# Patient Record
Sex: Female | Born: 1975 | Race: Black or African American | Hispanic: No | Marital: Single | Smoking: Current every day smoker
Health system: Southern US, Community
[De-identification: ages and names within clinical notes are randomized; demographics above are authoritative.]

## PROBLEM LIST (undated history)

## (undated) DIAGNOSIS — J45909 Unspecified asthma, uncomplicated: Secondary | ICD-10-CM

## (undated) HISTORY — DX: Unspecified asthma, uncomplicated: J45.909

---

## 2014-07-08 HISTORY — PX: HERNIA REPAIR: SHX51

## 2019-09-14 ENCOUNTER — Encounter: Payer: Self-pay | Admitting: Obstetrics & Gynecology

## 2019-09-14 ENCOUNTER — Ambulatory Visit (INDEPENDENT_AMBULATORY_CARE_PROVIDER_SITE_OTHER): Payer: Medicaid Other | Admitting: Obstetrics & Gynecology

## 2019-09-14 ENCOUNTER — Other Ambulatory Visit (HOSPITAL_COMMUNITY)
Admission: RE | Admit: 2019-09-14 | Discharge: 2019-09-14 | Disposition: A | Discharge status: Home / Self Care | Payer: Medicaid Other | Source: Ambulatory Visit | Attending: Obstetrics & Gynecology | Admitting: Obstetrics & Gynecology

## 2019-09-14 ENCOUNTER — Other Ambulatory Visit: Payer: Self-pay

## 2019-09-14 VITALS — BP 170/105 | HR 91 | Ht 60.0 in | Wt 304.1 lb

## 2019-09-14 DIAGNOSIS — I10 Essential (primary) hypertension: Secondary | ICD-10-CM | POA: Insufficient documentation

## 2019-09-14 DIAGNOSIS — Z01419 Encounter for gynecological examination (general) (routine) without abnormal findings: Secondary | ICD-10-CM

## 2019-09-14 DIAGNOSIS — I159 Secondary hypertension, unspecified: Secondary | ICD-10-CM | POA: Diagnosis not present

## 2019-09-14 DIAGNOSIS — Z6841 Body Mass Index (BMI) 40.0 and over, adult: Secondary | ICD-10-CM

## 2019-09-14 DIAGNOSIS — Z30432 Encounter for removal of intrauterine contraceptive device: Secondary | ICD-10-CM | POA: Diagnosis not present

## 2019-09-14 DIAGNOSIS — J45909 Unspecified asthma, uncomplicated: Secondary | ICD-10-CM | POA: Insufficient documentation

## 2019-09-14 DIAGNOSIS — J4520 Mild intermittent asthma, uncomplicated: Secondary | ICD-10-CM

## 2019-09-14 NOTE — Patient Instructions (Signed)
Mammogram °A mammogram is an X-ray of the breasts that is done to check for changes that are not normal. This test can screen for and find any changes that may suggest breast cancer. Mammograms are regularly done on women. A man may have a mammogram if he has a lump or swelling in his breast. This test can also help to find other changes and variations in the breast. °Tell a doctor: °· About any allergies you have. °· If you have breast implants. °· If you have had previous breast disease, biopsy, or surgery. °· If you are breastfeeding. °· If you are younger than age 25. °· If you have a family history of breast cancer. °· Whether you are pregnant or may be pregnant. °What are the risks? °Generally, this is a safe procedure. However, problems may occur, including: °· Exposure to radiation. Radiation levels are very low with this test. °· The results being misinterpreted. °· The need for further tests. °· The inability of the mammogram to detect certain cancers. °What happens before the procedure? °· Have this test done about 1-2 weeks after your period. This is usually when your breasts are the least tender. °· If you are visiting a new doctor or clinic, send any past mammogram images to your new doctor's office. °· Wash your breasts and under your arms the day of the test. °· Do not use deodorants, perfumes, lotions, or powders on the day of the test. °· Take off any jewelry from your neck. °· Wear clothes that you can change into and out of easily. °What happens during the procedure? ° °· You will undress from the waist up. You will put on a gown. °· You will stand in front of the X-ray machine. °· Each breast will be placed between two plastic or glass plates. The plates will press down on your breast for a few seconds. Try to stay as relaxed as possible. This does not cause any harm to your breasts. Any discomfort you feel will be very brief. °· X-rays will be taken from different angles of each breast. °The  procedure may vary among doctors and hospitals. °What happens after the procedure? °· The mammogram will be read by a specialist (radiologist). °· You may need to do certain parts of the test again. This depends on the quality of the images. °· Ask when your test results will be ready. Make sure you get your test results. °· You may go back to your normal activities. °Summary °· A mammogram is a low energy X-ray of the breasts that is done to check for abnormal changes. A man may have this test if he has a lump or swelling in his breast. °· Before the procedure, tell your doctor about any breast problems that you have had in the past. °· Have this test done about 1-2 weeks after your period. °· For the test, each breast will be placed between two plastic or glass plates. The plates will press down on your breast for a few seconds. °· The mammogram will be read by a specialist (radiologist). Ask when your test results will be ready. Make sure you get your test results. °This information is not intended to replace advice given to you by your health care provider. Make sure you discuss any questions you have with your health care provider. °Document Revised: 02/12/2018 Document Reviewed: 02/12/2018 °Elsevier Patient Education © 2020 [employer] ° °

## 2019-09-14 NOTE — Progress Notes (Signed)
Pt is new patient, referral from PCP. Last pap unknown, pt reports she has always had normal pap. Pt has not had MMG recently, Pt reports hx of abnormal MMG in the past. Pt is currently SA, Mirena IUD since 2017 per patient. Pt would like to get her Mirena removed today.

## 2019-09-14 NOTE — Progress Notes (Signed)
Patient ID: Brooke Mills, female   DOB: [birth year], 44 y.o.   MRN: [id number]  Chief Complaint  Patient presents with  . New Patient (Initial Visit)  asks to remove IUD  HPI Brooke Mills is a 44 y.o. female.  [id number] She has occasional spotting but no heavy flow with Mirena in place since 2016. She asks for removal. She doesn't plan pregnancy but declines to have IUD replaced today. She says she will use condoms and spermacide HPI  History reviewed. No pertinent past medical history.  Past Surgical History:  Procedure Laterality Date  . HERNIA REPAIR  2016    Family History  Problem Relation Age of Onset  . Diabetes Mother   . Hypertension Mother   . Diabetes Father   . Cancer Maternal Grandmother   . Breast cancer Maternal Aunt     Social History Social History   Tobacco Use  . Smoking status: Current Every Day Smoker    Types: Cigars  . Smokeless tobacco: Never Used  Substance Use Topics  . Alcohol use: Not Currently  . Drug use: Never    Allergies  Allergen Reactions  . Penicillins     Current Outpatient Medications  Medication Sig Dispense Refill  . levonorgestrel (MIRENA) 20 MCG/24HR IUD 1 each by Intrauterine route once.    Marland Kitchen omeprazole (PRILOSEC) 10 MG capsule Take 10 mg by mouth daily.     No current facility-administered medications for this visit.    Review of Systems Review of Systems  Constitutional: Negative.   HENT: Negative.   Respiratory: Negative.   Cardiovascular: Negative.   Gastrointestinal: Negative.   Genitourinary: Negative.     Blood pressure (!) 170/105, pulse 91, height 5' (1.524 m), weight (!) 304 lb 1.6 oz (137.9 kg).  Physical Exam Physical Exam Vitals and nursing note reviewed. Exam conducted with a chaperone present.  Constitutional:      Appearance: She is obese. She is not ill-appearing.  HENT:     Head: Normocephalic and atraumatic.     Mouth/Throat:     Mouth: Mucous membranes are moist.     Comments: Missing  several teeth including upper front Eyes:     Pupils: Pupils are equal, round, and reactive to light.  Cardiovascular:     Rate and Rhythm: Normal rate and regular rhythm.  Pulmonary:     Effort: Pulmonary effort is normal.     Breath sounds: Normal breath sounds.  Abdominal:     Palpations: There is no mass.     Tenderness: There is no abdominal tenderness.     Comments: Obese with large pannous  Genitourinary:    General: Normal vulva.     Vagina: No vaginal discharge.     Comments: Pelvic exam: normal external genitalia, vulva, vagina, cervix, uterus and adnexa. String visible at os, time out done, IUd Mirena removed intact without difficulty.  Skin:    General: Skin is warm and dry.  Neurological:     General: No focal deficit present.     Mental Status: She is alert.  Psychiatric:        Mood and Affect: Mood normal.        Behavior: Behavior normal.   Breasts: breasts appear normal, no suspicious masses, no skin or nipple changes or axillary nodes, large and pendulous.   Data Reviewed Referral notes   Assessment Well woman exam with routine gynecological exam - Plan: Cytology - PAP( [place]), Cervicovaginal ancillary only( [place]), MM DIGITAL SCREENING BILATERAL,  HIV antibody (with reflex)  Class 3 severe obesity due to excess calories without serious comorbidity with body mass index (BMI) of 50.0 to 59.9 in adult [facility])  Mild intermittent asthma, unspecified whether complicated  Secondary hypertension  IUD in place for removal  Plan IUD removed, advised condoms for [facility]. May replace in the future at her request. F/U at PCP re hypertension. Mammogram ordered    Scheryl Darter 09/14/2019, 10:45 AM

## 2019-09-15 ENCOUNTER — Telehealth: Payer: Self-pay

## 2019-09-15 ENCOUNTER — Other Ambulatory Visit: Payer: Self-pay | Admitting: Obstetrics & Gynecology

## 2019-09-15 DIAGNOSIS — A5901 Trichomonal vulvovaginitis: Secondary | ICD-10-CM

## 2019-09-15 LAB — CERVICOVAGINAL ANCILLARY ONLY
Bacterial Vaginitis (gardnerella): NEGATIVE
Candida Glabrata: NEGATIVE
Candida Vaginitis: NEGATIVE
Chlamydia: NEGATIVE
Comment: NEGATIVE
Comment: NEGATIVE
Comment: NEGATIVE
Comment: NEGATIVE
Comment: NEGATIVE
Comment: NORMAL
Neisseria Gonorrhea: NEGATIVE
Trichomonas: POSITIVE — AB

## 2019-09-15 MED ORDER — METRONIDAZOLE 500 MG PO TABS: ORAL_TABLET | ORAL | 0 refills | Status: DC

## 2019-09-15 NOTE — Progress Notes (Signed)
Positive for trichomonas, will send Flagyl to the pharmacy

## 2019-09-15 NOTE — Telephone Encounter (Signed)
S/w pt and advised of results, treatment plan and rx sent. 

## 2019-09-16 LAB — CYTOLOGY - PAP
Comment: NEGATIVE
Diagnosis: NEGATIVE
High risk HPV: NEGATIVE

## 2020-10-24 ENCOUNTER — Ambulatory Visit: Payer: Medicaid Other

## 2020-11-21 ENCOUNTER — Ambulatory Visit: Payer: Medicaid Other | Admitting: Obstetrics and Gynecology

## 2020-12-11 ENCOUNTER — Other Ambulatory Visit: Payer: Self-pay

## 2020-12-11 ENCOUNTER — Other Ambulatory Visit (HOSPITAL_COMMUNITY)
Admission: RE | Admit: 2020-12-11 | Discharge: 2020-12-11 | Disposition: A | Discharge status: Home / Self Care | Payer: Medicaid Other | Source: Ambulatory Visit | Attending: Obstetrics | Admitting: Obstetrics

## 2020-12-11 ENCOUNTER — Encounter: Payer: Self-pay | Admitting: Obstetrics

## 2020-12-11 ENCOUNTER — Ambulatory Visit (INDEPENDENT_AMBULATORY_CARE_PROVIDER_SITE_OTHER): Payer: Medicaid Other | Admitting: Obstetrics

## 2020-12-11 VITALS — BP 137/83 | HR 103 | Ht 63.0 in | Wt 297.0 lb

## 2020-12-11 DIAGNOSIS — N898 Other specified noninflammatory disorders of vagina: Secondary | ICD-10-CM

## 2020-12-11 DIAGNOSIS — Z01419 Encounter for gynecological examination (general) (routine) without abnormal findings: Secondary | ICD-10-CM

## 2020-12-11 DIAGNOSIS — Z3009 Encounter for other general counseling and advice on contraception: Secondary | ICD-10-CM

## 2020-12-11 DIAGNOSIS — Z3202 Encounter for pregnancy test, result negative: Secondary | ICD-10-CM

## 2020-12-11 DIAGNOSIS — Z30011 Encounter for initial prescription of contraceptive pills: Secondary | ICD-10-CM

## 2020-12-11 DIAGNOSIS — N946 Dysmenorrhea, unspecified: Secondary | ICD-10-CM | POA: Diagnosis not present

## 2020-12-11 DIAGNOSIS — M62838 Other muscle spasm: Secondary | ICD-10-CM

## 2020-12-11 DIAGNOSIS — F17290 Nicotine dependence, other tobacco product, uncomplicated: Secondary | ICD-10-CM

## 2020-12-11 DIAGNOSIS — Z113 Encounter for screening for infections with a predominantly sexual mode of transmission: Secondary | ICD-10-CM

## 2020-12-11 DIAGNOSIS — Z1239 Encounter for other screening for malignant neoplasm of breast: Secondary | ICD-10-CM

## 2020-12-11 DIAGNOSIS — Z6841 Body Mass Index (BMI) 40.0 and over, adult: Secondary | ICD-10-CM

## 2020-12-11 DIAGNOSIS — J301 Allergic rhinitis due to pollen: Secondary | ICD-10-CM

## 2020-12-11 LAB — POCT URINE PREGNANCY: Preg Test, Ur: NEGATIVE

## 2020-12-11 MED ORDER — LORATADINE 10 MG PO TABS: 10.0000 mg | ORAL_TABLET | Freq: Every day | ORAL | 11 refills | Status: AC

## 2020-12-11 MED ORDER — PHENTERMINE HCL 37.5 MG PO CAPS: 37.5000 mg | ORAL_CAPSULE | ORAL | 2 refills | Status: AC

## 2020-12-11 MED ORDER — SLYND 4 MG PO TABS: 1.0000 | ORAL_TABLET | Freq: Every day | ORAL | 11 refills | Status: DC

## 2020-12-11 MED ORDER — IBUPROFEN 800 MG PO TABS: 800.0000 mg | ORAL_TABLET | Freq: Three times a day (TID) | ORAL | 5 refills | Status: AC | PRN

## 2020-12-11 MED ORDER — METHOCARBAMOL 750 MG PO TABS: 750.0000 mg | ORAL_TABLET | Freq: Three times a day (TID) | ORAL | 2 refills | Status: DC
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
## 2020-12-11 NOTE — Progress Notes (Addendum)
GYN presents for AEX/PAP/STD screening.  Wants to restart BC to control her periods as they are heavy again.  UPT today is NEGATIVE

## 2020-12-12 LAB — CERVICOVAGINAL ANCILLARY ONLY
Bacterial Vaginitis (gardnerella): POSITIVE — AB
Candida Glabrata: NEGATIVE
Candida Vaginitis: POSITIVE — AB
Chlamydia: NEGATIVE
Comment: NEGATIVE
Comment: NEGATIVE
Comment: NEGATIVE
Comment: NEGATIVE
Comment: NEGATIVE
Comment: NORMAL
Neisseria Gonorrhea: NEGATIVE
Trichomonas: POSITIVE — AB

## 2020-12-13 ENCOUNTER — Telehealth: Payer: Self-pay

## 2020-12-13 ENCOUNTER — Other Ambulatory Visit: Payer: Self-pay | Admitting: Obstetrics

## 2020-12-13 DIAGNOSIS — B379 Candidiasis, unspecified: Secondary | ICD-10-CM

## 2020-12-13 DIAGNOSIS — N760 Acute vaginitis: Secondary | ICD-10-CM

## 2020-12-13 DIAGNOSIS — A5901 Trichomonal vulvovaginitis: Secondary | ICD-10-CM

## 2020-12-13 MED ORDER — METRONIDAZOLE 500 MG PO TABS: 500.0000 mg | ORAL_TABLET | Freq: Two times a day (BID) | ORAL | 2 refills | Status: AC

## 2020-12-13 MED ORDER — FLUCONAZOLE 150 MG PO TABS: 150.0000 mg | ORAL_TABLET | Freq: Once | ORAL | 0 refills | Status: DC
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
## 2020-12-15 LAB — CYTOLOGY - PAP
Comment: NEGATIVE
Comment: NEGATIVE
Diagnosis: UNDETERMINED — AB
HPV 16: NEGATIVE
HPV 18 / 45: NEGATIVE
High risk HPV: POSITIVE — AB

## 2020-12-16 ENCOUNTER — Other Ambulatory Visit: Payer: Self-pay | Admitting: Obstetrics

## 2021-01-17 ENCOUNTER — Other Ambulatory Visit: Payer: Self-pay | Admitting: Obstetrics

## 2021-01-17 DIAGNOSIS — B379 Candidiasis, unspecified: Secondary | ICD-10-CM

## 2021-02-07 ENCOUNTER — Ambulatory Visit: Payer: Medicaid Other

## 2021-05-08 ENCOUNTER — Ambulatory Visit: Payer: Medicaid Other

## 2021-05-23 ENCOUNTER — Other Ambulatory Visit: Payer: Self-pay | Admitting: Obstetrics

## 2021-05-23 DIAGNOSIS — N946 Dysmenorrhea, unspecified: Secondary | ICD-10-CM

## 2021-05-23 DIAGNOSIS — M62838 Other muscle spasm: Secondary | ICD-10-CM

## 2021-07-25 ENCOUNTER — Emergency Department (HOSPITAL_COMMUNITY)
Admission: EM | Admit: 2021-07-25 | Discharge: 2021-07-25 | Disposition: A | Discharge status: Home / Self Care | Payer: Medicaid Other | Attending: Emergency Medicine | Admitting: Emergency Medicine

## 2021-07-25 ENCOUNTER — Emergency Department (HOSPITAL_COMMUNITY): Payer: Medicaid Other

## 2021-07-25 ENCOUNTER — Other Ambulatory Visit: Payer: Self-pay

## 2021-07-25 DIAGNOSIS — J45909 Unspecified asthma, uncomplicated: Secondary | ICD-10-CM | POA: Insufficient documentation

## 2021-07-25 DIAGNOSIS — I10 Essential (primary) hypertension: Secondary | ICD-10-CM | POA: Diagnosis not present

## 2021-07-25 DIAGNOSIS — R0981 Nasal congestion: Secondary | ICD-10-CM | POA: Diagnosis present

## 2021-07-25 DIAGNOSIS — Z20822 Contact with and (suspected) exposure to covid-19: Secondary | ICD-10-CM | POA: Diagnosis not present

## 2021-07-25 DIAGNOSIS — R000 Tachycardia, unspecified: Secondary | ICD-10-CM | POA: Diagnosis not present

## 2021-07-25 DIAGNOSIS — H9213 Otorrhea, bilateral: Secondary | ICD-10-CM | POA: Insufficient documentation

## 2021-07-25 DIAGNOSIS — R0602 Shortness of breath: Secondary | ICD-10-CM | POA: Diagnosis not present

## 2021-07-25 DIAGNOSIS — R519 Headache, unspecified: Secondary | ICD-10-CM | POA: Diagnosis not present

## 2021-07-25 LAB — RESP PANEL BY RT-PCR (FLU A&B, COVID) ARPGX2
Influenza A by PCR: NEGATIVE
Influenza B by PCR: NEGATIVE
SARS Coronavirus 2 by RT PCR: NEGATIVE

## 2021-07-25 MED ORDER — PREDNISONE 10 MG PO TABS: 20.0000 mg | ORAL_TABLET | Freq: Every day | ORAL | 0 refills | Status: DC

## 2021-07-25 NOTE — ED Notes (Signed)
Pt given discharge paperwork, states he ride was here and did not have time for vital signs or to review paperwork.

## 2021-07-25 NOTE — Discharge Instructions (Addendum)
Follow-up with ENT.  Given your symptoms are persistent you may need to have a repeat nasal endoscopy performed. Continue taking the medicines as they were prescribed.  It may take more than 1 month to fully alleviate the nasal congestion you are feeling. Please follow-up with a primary care doctor, they can help facilitate broad work-up such as checking her basic labs as well as managing chronic conditions.  If you do not currently have a primary information is provided above to establish with 1. Take the prednisone taper, take 40 mg for the next 5 days.  This should help reduce any inflammation you are feeling.

## 2021-07-25 NOTE — ED Triage Notes (Signed)
Pt c/o increase ear pain, congestion since May. Pt seen by ENT.

## 2021-07-25 NOTE — ED Provider Triage Note (Signed)
Emergency Medicine Provider Triage Evaluation Note  Edina Bible , a 46 y.o. female  was evaluated in triage.  Pt complains of ear pain, nasal congestion, cough.  Patient states this began in May.  Initially she felt like it was improving, but now she feels like it is worsening.  She has seen ENT for this.  She has a history of allergies, has been taking allergy medicines  Review of Systems  Positive: Congestion, cough, ear pressure Negative: fever  Physical Exam  BP (!) 150/108 (BP Location: Right Arm)    Pulse (!) 112    Temp 98.4 F (36.9 C) (Oral)    Resp (!) 22    SpO2 97%  Gen:   Awake, no distress   Resp:  Normal effort  MSK:   Moves extremities without difficulty    Medical Decision Making  Medically screening exam initiated at 11:53 AM.  Appropriate orders placed.  Sila Mccoin was informed that the remainder of the evaluation will be completed by another provider, this initial triage assessment does not replace that evaluation, and the importance of remaining in the ED until their evaluation is complete.  Resp panel   Franchot Heidelberg, PA-C 07/25/21 1155

## 2021-07-25 NOTE — ED Provider Notes (Signed)
[facility] EMERGENCY DEPARTMENT Provider Note   CSN: [id number] Arrival date & time: 07/25/21  1126     History  Chief Complaint  Patient presents with   Nasal Congestion    Brooke Mills is a 46 y.o. female.  HPI  This is a patient with history of morbid obesity, asthma, hypertension, allergic rhinitis, eustachian tube dysfunction presenting today with nasal congestion.  She has been having the symptoms since an ear infection in May 2021.  She is followed by ENT, was seen by them in December.  States that she has been trying what they have prescribed her including Flonase, 2 rounds of antibiotics, avoiding triggers but this is not alleviated her symptoms.  She does endorse some drainage from the ears bilaterally, no vision changes.  Intermittent headaches, not worst of life.  No nausea or vomiting, not having any chest pain.  She does endorse feeling short of breath occasionally, no wheezing at home.  Home Medications Prior to Admission medications   Medication Sig Start Date End Date Taking? Authorizing Provider  predniSONE (DELTASONE) 10 MG tablet Take 2 tablets (20 mg total) by mouth daily. 07/25/21  Yes Theron Arista, PA-C  Drospirenone (SLYND) 4 MG TABS Take 1 tablet by mouth daily with breakfast. 12/11/20   Brock Bad, MD  ibuprofen (ADVIL) 800 MG tablet Take 1 tablet (800 mg total) by mouth every 8 (eight) hours as needed. 12/11/20   Brock Bad, MD  loratadine (CLARITIN) 10 MG tablet Take 1 tablet (10 mg total) by mouth daily. 12/11/20   Brock Bad, MD  methocarbamol (ROBAXIN) 750 MG tablet TAKE 1 TABLET BY MOUTH 3 TIMES DAILY. 05/25/21   Brock Bad, MD  metroNIDAZOLE (FLAGYL) 500 MG tablet Take 1 tablet (500 mg total) by mouth 2 (two) times daily. 12/13/20   Brock Bad, MD  omeprazole (PRILOSEC) 10 MG capsule Take 10 mg by mouth daily.    [provider]  phentermine 37.5 MG capsule Take 1 capsule (37.5 mg total) by mouth  every morning. 12/11/20   Brock Bad, MD      Allergies    Penicillins    Review of Systems   Review of Systems  Constitutional:  Negative for fever.  HENT:  Positive for congestion, ear discharge and ear pain.    Physical Exam Updated Vital Signs BP (!) 150/108 (BP Location: Right Arm)    Pulse (!) 112    Temp 98.4 F (36.9 C) (Oral)    Resp (!) 22    SpO2 97%  Physical Exam Vitals and nursing note reviewed. Exam conducted with a chaperone present.  Constitutional:      General: She is not in acute distress.    Appearance: Normal appearance. She is obese.  HENT:     Head: Normocephalic and atraumatic.     Right Ear: Tympanic membrane normal.     Left Ear: Tympanic membrane normal.     Nose: Congestion present. No rhinorrhea.  Eyes:     General: No scleral icterus.       Right eye: No discharge.        Left eye: No discharge.     Extraocular Movements: Extraocular movements intact.     Pupils: Pupils are equal, round, and reactive to light.  Cardiovascular:     Rate and Rhythm: Regular rhythm. Tachycardia present.     Pulses: Normal pulses.     Heart sounds: Normal heart sounds. No murmur heard.  No friction rub. No gallop.  Pulmonary:     Effort: Pulmonary effort is normal. No respiratory distress.     Breath sounds: Normal breath sounds.  Abdominal:     General: Abdomen is flat. Bowel sounds are normal. There is no distension.     Palpations: Abdomen is soft.     Tenderness: There is no abdominal tenderness.  Skin:    General: Skin is warm and dry.     Coloration: Skin is not jaundiced.  Neurological:     Mental Status: She is alert. Mental status is at baseline.     Coordination: Coordination normal.   ED Results / Procedures / Treatments   Labs (all labs ordered are listed, but only abnormal results are displayed) Labs Reviewed  RESP PANEL BY RT-PCR (FLU A&B, COVID) [id number]    EKG None  Radiology DG Chest 2 View  Result Date: 07/25/2021 CLINICAL  DATA:  Cough and shortness of breath, history of asthma EXAM: CHEST - 2 VIEW COMPARISON:  None. FINDINGS: The heart size and mediastinal contours are within normal limits. Both lungs are clear. The visualized skeletal structures are unremarkable except for degenerative changes of the spine and old healed right rib fractures. Trachea midline. Apical pleural thickening noted. No pneumothorax. IMPRESSION: No active cardiopulmonary disease. Electronically Signed   By: Judie Petit.  Shick M.D.   On: 07/25/2021 14:02    Procedures Procedures    Medications Ordered in ED Medications - No data to display  ED Course/ Medical Decision Making/ A&P                           Medical Decision Making Amount and/or Complexity of Data Reviewed Radiology: ordered.  Risk Prescription drug management.   This is a 46 year old female presenting with nasal congestion and cough.  The symptoms have been going on for months, she is followed by ENT.  I personally reviewed the ENT notes for additional history.  I also personally reviewed the labs ordered as well as the x-ray and I agree with radiologist interpretation.  Lungs are clear to auscultation, no tachypnea on my exam.  I do not auscultate any wheezes, as she does not appear in respiratory distress.  She is mildly tachycardic but unable to auscultate any murmurs.  COVID/flu test ordered in triage, patient advised to follow-up on MyChart.  Radiograph was ordered due to persistent symptoms, does not show any evidence of pneumonia.  Additionally, no signs of cardiomegaly, widened mediastinum suggesting dissection, pneumothorax.  At this time I do not feel she would benefit from additional work-up here.  Patient advised to follow-up with PCP for additional work-up and to continue the recommendations as given by the ENT.  Advised to return for chest pain, syncope, difficulty breathing.  Will give short course of prednisone taper given history of asthma and subjective feeling of  short of breath.        Final Clinical Impression(s) / ED Diagnoses Final diagnoses:  Nasal congestion    Rx / DC Orders ED Discharge Orders          Ordered    predniSONE (DELTASONE) 10 MG tablet  Daily        07/25/21 1417              Theron Arista, PA-C 07/25/21 1423    Gerhard Munch, MD 07/26/21 1625

## 2021-09-22 ENCOUNTER — Other Ambulatory Visit: Payer: Self-pay | Admitting: Obstetrics

## 2021-09-22 DIAGNOSIS — N946 Dysmenorrhea, unspecified: Secondary | ICD-10-CM

## 2021-09-22 DIAGNOSIS — M62838 Other muscle spasm: Secondary | ICD-10-CM

## 2021-11-13 ENCOUNTER — Other Ambulatory Visit: Payer: Self-pay | Admitting: Obstetrics

## 2021-11-13 DIAGNOSIS — Z30011 Encounter for initial prescription of contraceptive pills: Secondary | ICD-10-CM

## 2021-11-20 ENCOUNTER — Ambulatory Visit: Payer: Medicaid Other | Admitting: Allergy and Immunology

## 2022-01-04 ENCOUNTER — Other Ambulatory Visit: Payer: Self-pay | Admitting: Obstetrics

## 2022-01-04 DIAGNOSIS — M62838 Other muscle spasm: Secondary | ICD-10-CM

## 2022-01-04 DIAGNOSIS — N946 Dysmenorrhea, unspecified: Secondary | ICD-10-CM

## 2022-01-29 ENCOUNTER — Encounter: Payer: Self-pay | Admitting: Allergy & Immunology

## 2022-01-29 ENCOUNTER — Ambulatory Visit (INDEPENDENT_AMBULATORY_CARE_PROVIDER_SITE_OTHER): Payer: Medicaid Other | Admitting: Allergy & Immunology

## 2022-01-29 VITALS — BP 150/72 | HR 109 | Temp 98.1°F | Resp 16 | Ht 60.83 in | Wt 321.0 lb

## 2022-01-29 DIAGNOSIS — J4530 Mild persistent asthma, uncomplicated: Secondary | ICD-10-CM | POA: Diagnosis not present

## 2022-01-29 DIAGNOSIS — T7800XD Anaphylactic reaction due to unspecified food, subsequent encounter: Secondary | ICD-10-CM

## 2022-01-29 DIAGNOSIS — H938X3 Other specified disorders of ear, bilateral: Secondary | ICD-10-CM | POA: Diagnosis not present

## 2022-01-29 DIAGNOSIS — J310 Chronic rhinitis: Secondary | ICD-10-CM

## 2022-01-29 MED ORDER — CEFDINIR 300 MG PO CAPS: 300.0000 mg | ORAL_CAPSULE | Freq: Two times a day (BID) | ORAL | 0 refills | Status: AC

## 2022-01-29 MED ORDER — AMOXICILLIN-POT CLAVULANATE 875-125 MG PO TABS: 1.0000 | ORAL_TABLET | Freq: Two times a day (BID) | ORAL | 0 refills | Status: DC

## 2022-01-29 NOTE — Progress Notes (Signed)
NEW PATIENT  Date of Service/Encounter:  01/29/22  Consult requested by: Patient, No Pcp Per   Assessment:   Chronic rhinitis  Sensation of fullness in both ears  Mild persistent asthma, uncomplicated  Anaphylactic shock due to food  Plan/Recommendations:   1. Chronic rhinitis - Testing today showed: negative to the entire panel - Copy of test results provided.  - Avoidance measures provided. - In the meantime, continue with the fluticasone and azelastine twice daily in combination with the cetirizine 10mg  daily  - Add on Singulair (montelukast) 10mg  daily (this can cause irritability rarely).  - We are getting blood work to confirm this.  - Consider nasal saline rinses 1-2 times daily to remove allergens from the nasal cavities as well as help with mucous clearance (this is especially helpful to do before the nasal sprays are given) - Consider allergy shots as a means of long-term control. - Allergy shots "re-train" and "reset" the immune system to ignore environmental allergens and decrease the resulting immune response to those allergens (sneezing, itchy watery eyes, runny nose, nasal congestion, etc).    - Allergy shots improve symptoms in 75-85% of patients.  - We can discuss more at the next appointment if the medications are not working for you.  2. Sensation of fullness in both ears - Be religious with the nose sprays - TWICE DAILY on both of them. - We are adding the SINGULAIR to help with this. - Start CEFDINIR 300mg  TWICE DAILY for ten days to help clear this up.  - We may have to order a sinus CT after this.   3. Mild persistent asthma, uncomplicated - Lung testing looked PERFECT today. - We are not going to make any changes at all. - Spacer use reviewed. - Daily controller medication(s): Flovent 2 puffs twice daily with spacer - Prior to physical activity: albuterol 2 puffs 10-15 minutes before physical activity. - Rescue medications: albuterol 4  puffs every 4-6 hours as needed - Changes during respiratory infections or worsening symptoms: Increase Flovent to 4 puffs twice daily for TWO WEEKS. - Asthma control goals:  * Full participation in all desired activities (may need albuterol before activity) * Albuterol use two time or less a week on average (not counting use with activity) * Cough interfering with sleep two time or less a month * Oral steroids no more than once a year * No hospitalizations  4. Return in about 6 weeks (around 03/12/2022).    This note in its entirety was forwarded to the Provider who requested this consultation.  Subjective:   Brooke Mills is a 46 y.o. female presenting today for evaluation of  Chief Complaint  Patient presents with   Allergy Testing    Environmental    Other    Eye tubes clogged up. Been like this for over a year now.    Brooke Mills has a history of the following: Patient Active Problem List   Diagnosis Date Noted   Class 3 severe obesity due to excess calories without serious comorbidity with body mass index (BMI) of 50.0 to 59.9 in adult [facility]) 09/14/2019   Asthma 09/14/2019   Hypertension 09/14/2019    History obtained from: chart review and patient.  Brooke Mills was referred by Patient, No Pcp Per.     Brooke Mills is a 46 y.o. female presenting for an evaluation of allergies and asthma .  She saw Dr. Marene Lenz in November 2022. [place] reports that she was going to have  to put tubes in, but her ears looked better at follow up in December so she did not do anything.   Asthma/Respiratory Symptom History: She is on Flovent two puffs BID. She has albuterol that she uses intermittently, around  1-2 times per day. She is not on prednisone daily, but intermittent bursts. She has not been hospitalized.   Allergic Rhinitis Symptom History: She moved down here in 2019 from [place]. She saw someone in [place], [place]. She was going to start shots, but she never got  around to it before she left. She had intradermals to show the results. She thinks that testing was done in 2017. She has not had a sinus CT at all. She is on cetirizine which she started after taking loratadine for a number of years. She has not had an antibiotic in ages.  She has been on Flonase and azelastine which is not working. She has had a "heavy" dose of prednisone for 5 days which does help a bit but it is still a problem. She has been using prednisone intermittently to help. She is having popping of her ears and asthma symptoms all of the time. She is sneezing constantly.   Food Allergy Symptom History: She had itching and scratching with walnuts. She is fine with peanuts, it was only the walnuts.   Otherwise, there is no history of other atopic diseases, including food allergies, drug allergies, stinging insect allergies, or contact dermatitis. There is no significant infectious history. Vaccinations are up to date.    Past Medical History: Patient Active Problem List   Diagnosis Date Noted   Class 3 severe obesity due to excess calories without serious comorbidity with body mass index (BMI) of 50.0 to 59.9 in adult [facility] 09/14/2019   Asthma 09/14/2019   Hypertension 09/14/2019    Medication List:  Allergies as of 01/29/2022       Reactions   Penicillins         Medication List        Accurate as of January 29, 2022 11:59 PM. If you have any questions, ask your nurse or doctor.          STOP taking these medications    predniSONE 10 MG tablet Commonly known as: DELTASONE Stopped by: Alfonse Spruce, MD       TAKE these medications    cefdinir 300 MG capsule Commonly known as: OMNICEF Take 1 capsule (300 mg total) by mouth 2 (two) times daily for 10 days. Started by: Alfonse Spruce, MD   cetirizine 10 MG tablet Commonly known as: ZYRTEC Take 1 tablet by mouth daily.   EPINEPHrine 0.3 mg/0.3 mL Soaj injection Commonly known as: EPI-PEN USE AS  DIRECTED AS NEEDED FOR ANAPHYLAXIS   fluticasone 110 MCG/ACT inhaler Commonly known as: FLOVENT HFA Inhale 2 puffs into the lungs 2 (two) times daily.   ibuprofen 800 MG tablet Commonly known as: ADVIL Take 1 tablet (800 mg total) by mouth every 8 (eight) hours as needed.   loratadine 10 MG tablet Commonly known as: Claritin Take 1 tablet (10 mg total) by mouth daily.   methocarbamol 750 MG tablet Commonly known as: ROBAXIN TAKE 1 TABLET BY MOUTH THREE TIMES A DAY   metroNIDAZOLE 500 MG tablet Commonly known as: FLAGYL Take 1 tablet (500 mg total) by mouth 2 (two) times daily.   omeprazole 10 MG capsule Commonly known as: PRILOSEC Take 10 mg by mouth daily.   phentermine 37.5 MG capsule Take 1  capsule (37.5 mg total) by mouth every morning.   Slynd 4 MG Tabs Generic drug: Drospirenone TAKE 1 TABLET BY MOUTH EVERY DAY WITH BREAKFAST   albuterol (2.5 MG/3ML) 0.083% nebulizer solution Commonly known as: PROVENTIL Take by nebulization.   Ventolin HFA 108 (90 Base) MCG/ACT inhaler Generic drug: albuterol Inhale 2 puffs into the lungs every 4 (four) hours as needed.        Birth History: non-contributory  Developmental History: non-contributory  Past Surgical History: Past Surgical History:  Procedure Laterality Date   HERNIA REPAIR  2016     Family History: Family History  Problem Relation Age of Onset   Diabetes Mother    Hypertension Mother    Diabetes Father    Cancer Maternal Grandmother    Breast cancer Maternal Aunt      Social History: Jordann lives at home with her husband and her son.  She lives in a house that was built in 2019.  There is carpeting throughout the home.  There is no mildew damage.  She has gas heating and window units for cooling.  There are dogs inside of the home.  There are dust mite covers on the bedding.  There is no tobacco exposure.  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  She is not  exposed to fumes, chemicals, or dust.  She does not use a HEPA filter.  She does not live near an interstate or industrial area.   Review of Systems  Constitutional: Negative.  Negative for chills, fever, malaise/fatigue and weight loss.  HENT:  Positive for congestion. Negative for ear discharge and ear pain.   Eyes:  Negative for pain, discharge and redness.  Respiratory:  Positive for cough, shortness of breath and wheezing. Negative for sputum production.   Cardiovascular: Negative.  Negative for chest pain and palpitations.  Gastrointestinal:  Negative for abdominal pain, heartburn, nausea and vomiting.  Skin: Negative.  Negative for itching and rash.  Neurological:  Negative for dizziness and headaches.  Endo/Heme/Allergies:  Negative for environmental allergies. Does not bruise/bleed easily.       Objective:   Blood pressure (!) 150/72, pulse (!) 109, temperature 98.1 F (36.7 C), temperature source Temporal, resp. rate 16, height 5' 0.83" (1.545 m), weight (!) 321 lb (145.6 kg), SpO2 97 %. Body mass index is 61 kg/m.     Physical Exam Vitals reviewed.  Constitutional:      Appearance: She is well-developed.  HENT:     Head: Normocephalic and atraumatic.     Right Ear: Tympanic membrane, ear canal and external ear normal. No drainage, swelling or tenderness. Tympanic membrane is not injected, scarred, erythematous, retracted or bulging.     Left Ear: Tympanic membrane, ear canal and external ear normal. No drainage, swelling or tenderness. Tympanic membrane is not injected, scarred, erythematous, retracted or bulging.     Nose: No nasal deformity, septal deviation, mucosal edema or rhinorrhea.     Right Turbinates: Enlarged, swollen and pale.     Left Turbinates: Enlarged, swollen and pale.     Right Sinus: No maxillary sinus tenderness or frontal sinus tenderness.     Left Sinus: No maxillary sinus tenderness or frontal sinus tenderness.     Mouth/Throat:     Mouth:  Mucous membranes are not pale and not dry.     Pharynx: Uvula midline.  Eyes:     General: Lids are normal. Allergic shiner present.  Right eye: No discharge.        Left eye: No discharge.     Conjunctiva/sclera: Conjunctivae normal.     Right eye: Right conjunctiva is not injected. No chemosis.    Left eye: Left conjunctiva is not injected. No chemosis.    Pupils: Pupils are equal, round, and reactive to light.  Cardiovascular:     Rate and Rhythm: Normal rate and regular rhythm.     Heart sounds: Normal heart sounds.  Pulmonary:     Effort: Pulmonary effort is normal. No tachypnea, accessory muscle usage or respiratory distress.     Breath sounds: Normal breath sounds. No wheezing, rhonchi or rales.     Comments: Moving air well in all lung fields. No increased work of breathing noted.  Chest:     Chest wall: No tenderness.  Abdominal:     Tenderness: There is no abdominal tenderness. There is no guarding or rebound.  Lymphadenopathy:     Head:     Right side of head: No submandibular, tonsillar or occipital adenopathy.     Left side of head: No submandibular, tonsillar or occipital adenopathy.     Cervical: No cervical adenopathy.  Skin:    Coloration: Skin is not pale.     Findings: No abrasion, erythema, petechiae or rash. Rash is not papular, urticarial or vesicular.  Neurological:     Mental Status: She is alert.  Psychiatric:        Behavior: Behavior is cooperative.      Diagnostic studies:    Spirometry: results normal (FEV1: 1.53/74%, FVC: 2.09/82%, FEV1/FVC: 73%).    Spirometry consistent with normal pattern.   Allergy Studies:     Airborne Adult Perc - 01/29/22 1450     Time Antigen Placed 1456    Allergen Manufacturer Waynette Buttery    Location Back    Number of Test 59    1. Control-Buffer 50% Glycerol Negative    2. Control-Histamine 1 mg/ml 2+    3. Albumin saline Negative    4. Bahia Negative    5. French Southern Territ[place] Negative    6. Johnson Negative    7.  Kent[place] Blue Negative    8. Meadow Fescue Negative    9. Perennial Rye Negative    10. Sweet Vernal Negative    11. Timothy Negative    12. Cocklebur Negative    13. Burweed Marshelder Negative    14. Ragweed, short Negative    15. Ragweed, Giant Negative    16. Plantain,  English Negative    17. Lamb's Quarters Negative    18. Sheep Sorrell Negative    19. Rough Pigweed Negative    20. Marsh Elder, Rough Negative    21. Mugwort, Common Negative    22. Ash mix Negative    23. Birch mix Negative    24. Beech American Negative    25. Box, Elder Negative    26. Cedar, red Negative    27. Cottonwood, Guinea-Bissau Negative    28. Elm mix Negative    29. Hickory Negative    30. Maple mix Negative    31. Oak, Guinea-[place]au mix Negative    32. Pecan Pollen Negative    33. Pine mix Negative    34. Sycamore Eastern Negative    35. Walnut, Black Pollen Negative    36. Alternaria alternata Negative    37. Cladosporium Herbarum Negative    38. Aspergillus mix Negative    39. Penicillium mix Negative  40. Bipolaris sorokiniana (Helminthosporium) Negative    41. Drechslera spicifera (Curvularia) Negative    42. Mucor plumbeus Negative    43. Fusarium moniliforme Negative    44. Aureobasidium pullulans (pullulara) Negative    45. Rhizopus oryzae Negative    46. Botrytis cinera Negative    47. Epicoccum nigrum Negative    48. Phoma betae Negative    49. Candida Albicans Negative    50. Trichophyton mentagrophytes Negative    51. Mite, D Farinae  5,000 AU/ml Negative    52. Mite, D Pteronyssinus  5,000 AU/ml Negative    53. Cat Hair 10,000 BAU/ml Negative    54.  Dog Epithelia Negative    55. Mixed Feathers Negative    56. Horse Epithelia Negative    57. Cockroach, German Negative    58. Mouse Negative    59. Tobacco Leaf Negative             Food Adult Perc - 01/29/22 1400     Time Antigen Placed 1457    Allergen Manufacturer Waynette Buttery    Location Back    Number of allergen test 9     1. Peanut Negative    10. Cashew Negative    11. Pecan Food Negative    12. Walnut Food Negative    13. Almond Negative    14. Hazelnut Negative    15. [place] nut Negative    16. Coconut Negative    17. Pistachio Negative             Allergy testing results were read and interpreted by myself, documented by clinical staff.         Malachi Bonds, MD [facility]

## 2022-01-29 NOTE — Patient Instructions (Addendum)
1. Chronic rhinitis - Testing today showed: negative to the entire panel - Copy of test results provided.  - Avoidance measures provided. - In the meantime, continue with the fluticasone and azelastine twice daily in combination with the cetirizine 10mg  daily  - Add on Singulair (montelukast) 10mg  daily (this can cause irritability rarely).  - We are getting blood work to confirm this.  - Consider nasal saline rinses 1-2 times daily to remove allergens from the nasal cavities as well as help with mucous clearance (this is especially helpful to do before the nasal sprays are given) - Consider allergy shots as a means of long-term control. - Allergy shots "re-train" and "reset" the immune system to ignore environmental allergens and decrease the resulting immune response to those allergens (sneezing, itchy watery eyes, runny nose, nasal congestion, etc).    - Allergy shots improve symptoms in 75-85% of patients.  - We can discuss more at the next appointment if the medications are not working for you.  2. Sensation of fullness in both ears - Be religious with the nose sprays - TWICE DAILY on both of them. - We are adding the SINGULAIR to help with this. - Start CEFDINIR 300mg  TWICE DAILY for ten days to help clear this up.  - We may have to order a sinus CT after this.   3. Mild persistent asthma, uncomplicated - Lung testing looked PERFECT today. - We are not going to make any changes at all. - Spacer use reviewed. - Daily controller medication(s): Flovent 2 puffs twice daily with spacer - Prior to physical activity: albuterol 2 puffs 10-15 minutes before physical activity. - Rescue medications: albuterol 4 puffs every 4-6 hours as needed - Changes during respiratory infections or worsening symptoms: Increase Flovent 09-25-1995 to 4 puffs twice daily for TWO WEEKS. - Asthma control goals:  * Full participation in all desired activities (may need albuterol before activity) * Albuterol  use two time or less a week on average (not counting use with activity) * Cough interfering with sleep two time or less a month * Oral steroids no more than once a year * No hospitalizations  4. Return in about 6 weeks (around 03/12/2022).    Please inform of any Emergency Department visits, hospitalizations, or changes in symptoms. Call before going to the ED for breathing or allergy symptoms since we might be able to fit you in for a sick visit. Feel free to contact 05/12/2022 anytime with any questions, problems, or concerns.  It was a pleasure to meet you today!  Websites that have reliable patient information: 1. American Academy of Asthma, Allergy, and Immunology: www.aaaai.org 2. Food Allergy Research and Education (FARE): foodallergy.org 3. Mothers of Asthmatics: http://www.asthmacommunitynetwork.org 4. American College of Allergy, Asthma, and Immunology: www.acaai.org   COVID-19 Vaccine Information can be found at: [place] For questions related to vaccine distribution or appointments, please email [email or web address] or call [phone].   We realize that you might be concerned about having an allergic reaction to the COVID19 vaccines. To help with that concern, WE ARE OFFERING THE COVID19 VACCINES IN OUR OFFICE! Ask the front desk for dates!     "Like" [place] on Facebook and Instagram for our latest updates!      A healthy democracy works best when [email or web address] participate! Make sure you are registered to vote! If you have moved or changed any of your contact information, you will need to get this updated before voting!  In some cases, you  MAY be able to register to vote online: [email or web address]         Airborne Adult Perc - 01/29/22 1450     Time Antigen Placed 1456    Allergen Manufacturer Waynette Buttery    Location Back    Number of Test 59    1. Control-Buffer 50% Glycerol  Negative    2. Control-Histamine 1 mg/ml 2+    3. Albumin saline Negative    4. Bahia Negative    5. [place] Negative    6. Johnson Negative    7. Kentucky Blue Negative    8. Meadow Fescue Negative    9. Perennial Rye Negative    10. Sweet Vernal Negative    11. Timothy Negative    12. Cocklebur Negative    13. Burweed Marshelder Negative    14. Ragweed, short Negative    15. Ragweed, Giant Negative    16. Plantain,  English Negative    17. Lamb's Quarters Negative    18. Sheep Sorrell Negative    19. Rough Pigweed Negative    20. Marsh Elder, Rough Negative    21. Mugwort, Common Negative    22. Ash mix Negative    23. Birch mix Negative    24. Beech American Negative    25. Box, Elder Negative    26. Cedar, red Negative    27. Cottonwood, [place] Negative    28. Elm mix Negative    29. Hickory Negative    30. Maple mix Negative    31. Oak, [place] mix Negative    32. Pecan Pollen Negative    33. Pine mix Negative    34. Sycamore Eastern Negative    35. Walnut, Black Pollen Negative    36. Alternaria alternata Negative    37. Cladosporium Herbarum Negative    38. Aspergillus mix Negative    39. Penicillium mix Negative    40. Bipolaris sorokiniana (Helminthosporium) Negative    41. Drechslera spicifera (Curvularia) Negative    42. Mucor plumbeus Negative    43. Fusarium moniliforme Negative    44. Aureobasidium pullulans (pullulara) Negative    45. Rhizopus oryzae Negative    46. Botrytis cinera Negative    47. Epicoccum nigrum Negative    48. Phoma betae Negative    49. Candida Albicans Negative    50. Trichophyton mentagrophytes Negative    51. Mite, D Farinae  5,000 AU/ml Negative    52. Mite, D Pteronyssinus  5,000 AU/ml Negative    53. Cat Hair 10,000 BAU/ml Negative    54.  Dog Epithelia Negative    55. Mixed Feathers Negative    56. Horse Epithelia Negative    57. Cockroach, German Negative    58. Mouse Negative    59. Tobacco Leaf Negative              Food Adult Perc - 01/29/22 1400     Time Antigen Placed 1457    Allergen Manufacturer Waynette Buttery    Location Back    Number of allergen test 9    1. Peanut Negative    10. Cashew Negative    11. Pecan Food Negative    12. Walnut Food Negative    13. Almond Negative    14. Hazelnut Negative    15. [place] nut Negative    16. Coconut Negative    17. Pistachio Negative

## 2022-01-30 ENCOUNTER — Encounter: Payer: Self-pay | Admitting: Allergy & Immunology

## 2022-01-30 MED ORDER — MONTELUKAST SODIUM 10 MG PO TABS: 10.0000 mg | ORAL_TABLET | Freq: Every day | ORAL | 5 refills | Status: AC

## 2022-01-30 MED ORDER — AZELASTINE HCL 0.1 % NA SOLN: 1.0000 | Freq: Two times a day (BID) | NASAL | 5 refills | Status: AC

## 2022-01-30 MED ORDER — FLUTICASONE PROPIONATE HFA 110 MCG/ACT IN AERO: 2.0000 | INHALATION_SPRAY | Freq: Two times a day (BID) | RESPIRATORY_TRACT | 5 refills | Status: AC

## 2022-01-30 MED ORDER — FLUTICASONE PROPIONATE 50 MCG/ACT NA SUSP: 2.0000 | Freq: Every day | NASAL | 5 refills | Status: AC

## 2022-02-01 LAB — IGE NUT PROF. W/COMPONENT RFLX
F017-IgE Hazelnut (Filbert): <0.1 kU/L
F018-IgE Brazil Nut: <0.1 kU/L
F020-IgE Almond: <0.1 kU/L
F202-IgE Cashew Nut: <0.1 kU/L
F203-IgE Pistachio Nut: <0.1 kU/L
F256-IgE Walnut: <0.1 kU/L
Macadamia Nut, IgE: <0.1 kU/L
Peanut, IgE: <0.1 kU/L
Pecan Nut IgE: <0.1 kU/L

## 2022-02-04 LAB — ALLERGEN PROFILE, MOLD
Aureobasidi Pullulans IgE: <0.1 kU/L
Candida Albicans IgE: 0.11 kU/L — AB
M009-IgE Fusarium proliferatum: <0.1 kU/L
M014-IgE Epicoccum purpur: <0.1 kU/L
Mucor Racemosus IgE: <0.1 kU/L
Phoma Betae IgE: <0.1 kU/L
Setomelanomma Rostrat: <0.1 kU/L
Stemphylium Herbarum IgE: <0.1 kU/L

## 2022-02-04 LAB — ALLERGENS W/COMP RFLX AREA 2
Alternaria Alternata IgE: <0.1 kU/L
Aspergillus Fumigatus IgE: <0.1 kU/L
Bermuda Grass IgE: <0.1 kU/L
Cedar, Mountain IgE: <0.1 kU/L
Cladosporium Herbarum IgE: <0.1 kU/L
Cockroach, German IgE: 0.13 kU/L — AB
Common Silver Birch IgE: <0.1 kU/L
Cottonwood IgE: <0.1 kU/L
D Farinae IgE: <0.1 kU/L
D Pteronyssinus IgE: <0.1 kU/L
E001-IgE Cat Dander: <0.1 kU/L
E005-IgE Dog Dander: <0.1 kU/L
Elm, American IgE: <0.1 kU/L
IgE (Immunoglobulin E), Serum: 329 IU/mL (ref 6–495)
Johnson Grass IgE: <0.1 kU/L
Maple/Box Elder IgE: <0.1 kU/L
Mouse Urine IgE: <0.1 kU/L
Oak, White IgE: <0.1 kU/L
Pecan, Hickory IgE: <0.1 kU/L
Penicillium Chrysogen IgE: <0.1 kU/L
Pigweed, Rough IgE: <0.1 kU/L
Ragweed, Short IgE: <0.1 kU/L
Sheep Sorrel IgE Qn: <0.1 kU/L
Timothy Grass IgE: <0.1 kU/L
White Mulberry IgE: <0.1 kU/L

## 2022-02-04 LAB — ASPERGILLUS PRECIPITINS
A.Fumigatus #1 Abs: NEGATIVE
Aspergillus Flavus Antibodies: NEGATIVE
Aspergillus Niger Antibodies: NEGATIVE
Aspergillus glaucus IgG: NEGATIVE
Aspergillus nidulans IgG: NEGATIVE
Aspergillus terreus IgG: NEGATIVE

## 2022-02-04 LAB — CBC WITH DIFFERENTIAL/PLATELET
Basophils Absolute: 0 10*3/uL (ref 0.0–0.2)
Basos: 1 %
EOS (ABSOLUTE): 0.1 10*3/uL (ref 0.0–0.4)
Eos: 4 %
Hematocrit: 43.5 % (ref 34.0–46.6)
Hemoglobin: 14.1 g/dL (ref 11.1–15.9)
Immature Grans (Abs): 0 10*3/uL (ref 0.0–0.1)
Immature Granulocytes: 1 %
Lymphocytes Absolute: 0.8 10*3/uL (ref 0.7–3.1)
Lymphs: 28 %
MCH: 26.4 pg — ABNORMAL LOW (ref 26.6–33.0)
MCHC: 32.4 g/dL (ref 31.5–35.7)
MCV: 81 fL (ref 79–97)
Monocytes Absolute: 0.4 10*3/uL (ref 0.1–0.9)
Monocytes: 12 %
Neutrophils Absolute: 1.6 10*3/uL (ref 1.4–7.0)
Neutrophils: 54 %
Platelets: 212 10*3/uL (ref 150–450)
RBC: 5.35 x10E6/uL — ABNORMAL HIGH (ref 3.77–5.28)
RDW: 14.3 % (ref 11.7–15.4)
WBC: 3 10*3/uL — ABNORMAL LOW (ref 3.4–10.8)

## 2022-02-04 LAB — ALPHA-1-ANTITRYPSIN: A-1 Antitrypsin: 208 mg/dL — ABNORMAL HIGH (ref 101–187)

## 2022-02-04 LAB — ANCA TITERS
Atypical pANCA: <1:20 {titer}
C-ANCA: <1:20 {titer}
P-ANCA: <1:20 {titer}

## 2022-03-26 ENCOUNTER — Other Ambulatory Visit: Payer: Self-pay | Admitting: Obstetrics

## 2022-03-26 DIAGNOSIS — N946 Dysmenorrhea, unspecified: Secondary | ICD-10-CM

## 2022-03-26 DIAGNOSIS — M62838 Other muscle spasm: Secondary | ICD-10-CM

## 2022-05-23 ENCOUNTER — Ambulatory Visit: Payer: Medicaid Other | Admitting: Obstetrics

## 2022-09-06 DIAGNOSIS — Z136 Encounter for screening for cardiovascular disorders: Secondary | ICD-10-CM | POA: Diagnosis not present

## 2022-09-06 DIAGNOSIS — I10 Essential (primary) hypertension: Secondary | ICD-10-CM | POA: Diagnosis not present

## 2022-10-03 DIAGNOSIS — D509 Iron deficiency anemia, unspecified: Secondary | ICD-10-CM | POA: Diagnosis not present

## 2022-10-03 DIAGNOSIS — Z3202 Encounter for pregnancy test, result negative: Secondary | ICD-10-CM | POA: Diagnosis not present

## 2023-01-29 ENCOUNTER — Other Ambulatory Visit (HOSPITAL_COMMUNITY): Payer: Self-pay

## 2023-05-12 ENCOUNTER — Other Ambulatory Visit: Payer: Self-pay | Admitting: Family Medicine

## 2023-05-12 DIAGNOSIS — M62838 Other muscle spasm: Secondary | ICD-10-CM

## 2023-05-12 DIAGNOSIS — N946 Dysmenorrhea, unspecified: Secondary | ICD-10-CM

## 2023-07-17 IMAGING — CR DG CHEST 2V
2 series · 2 of 2 positions shown · non-contrast
Comparison: None.

CLINICAL DATA: Cough and shortness of breath, history of asthma

EXAM:
CHEST - 2 VIEW

[chest pa]
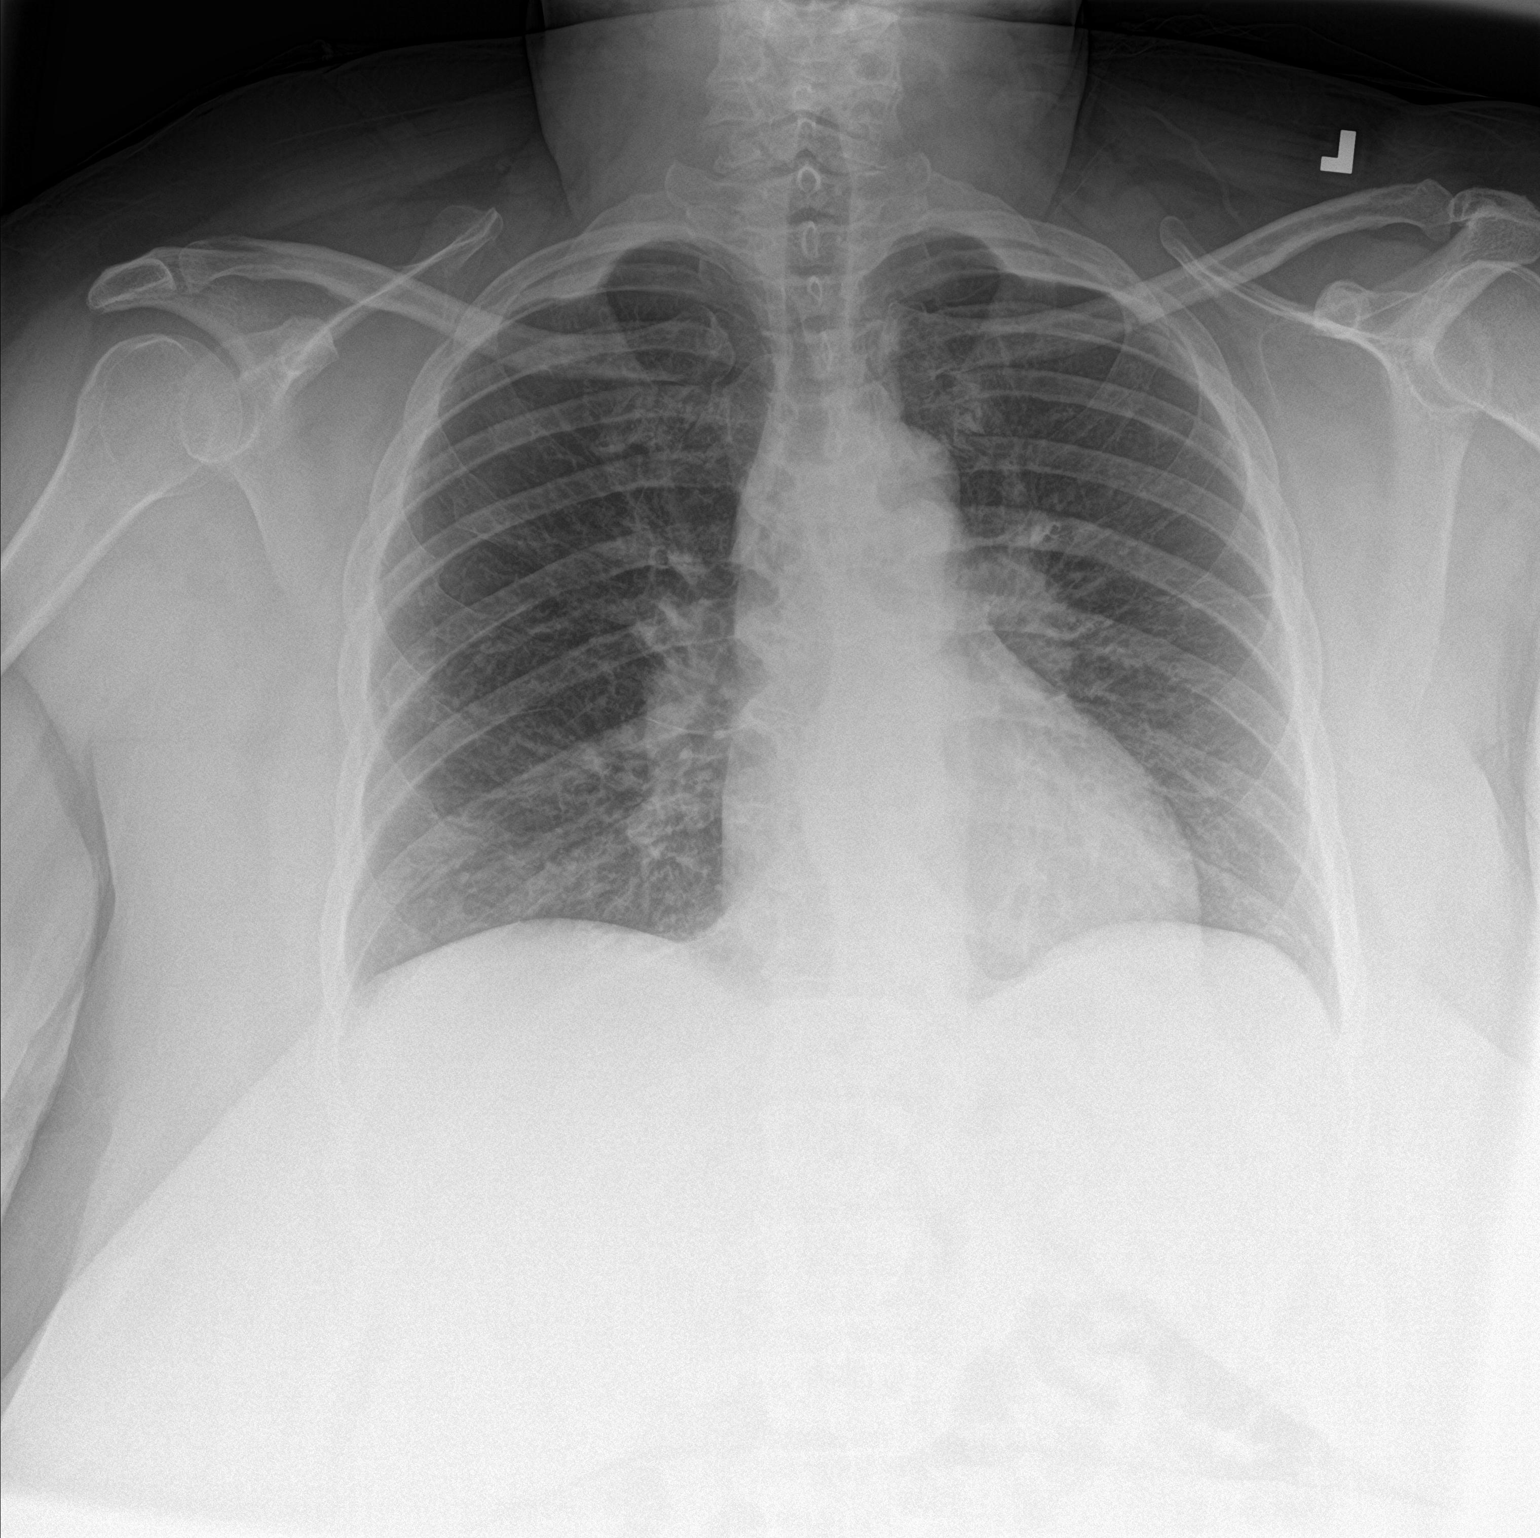

[chest lat]
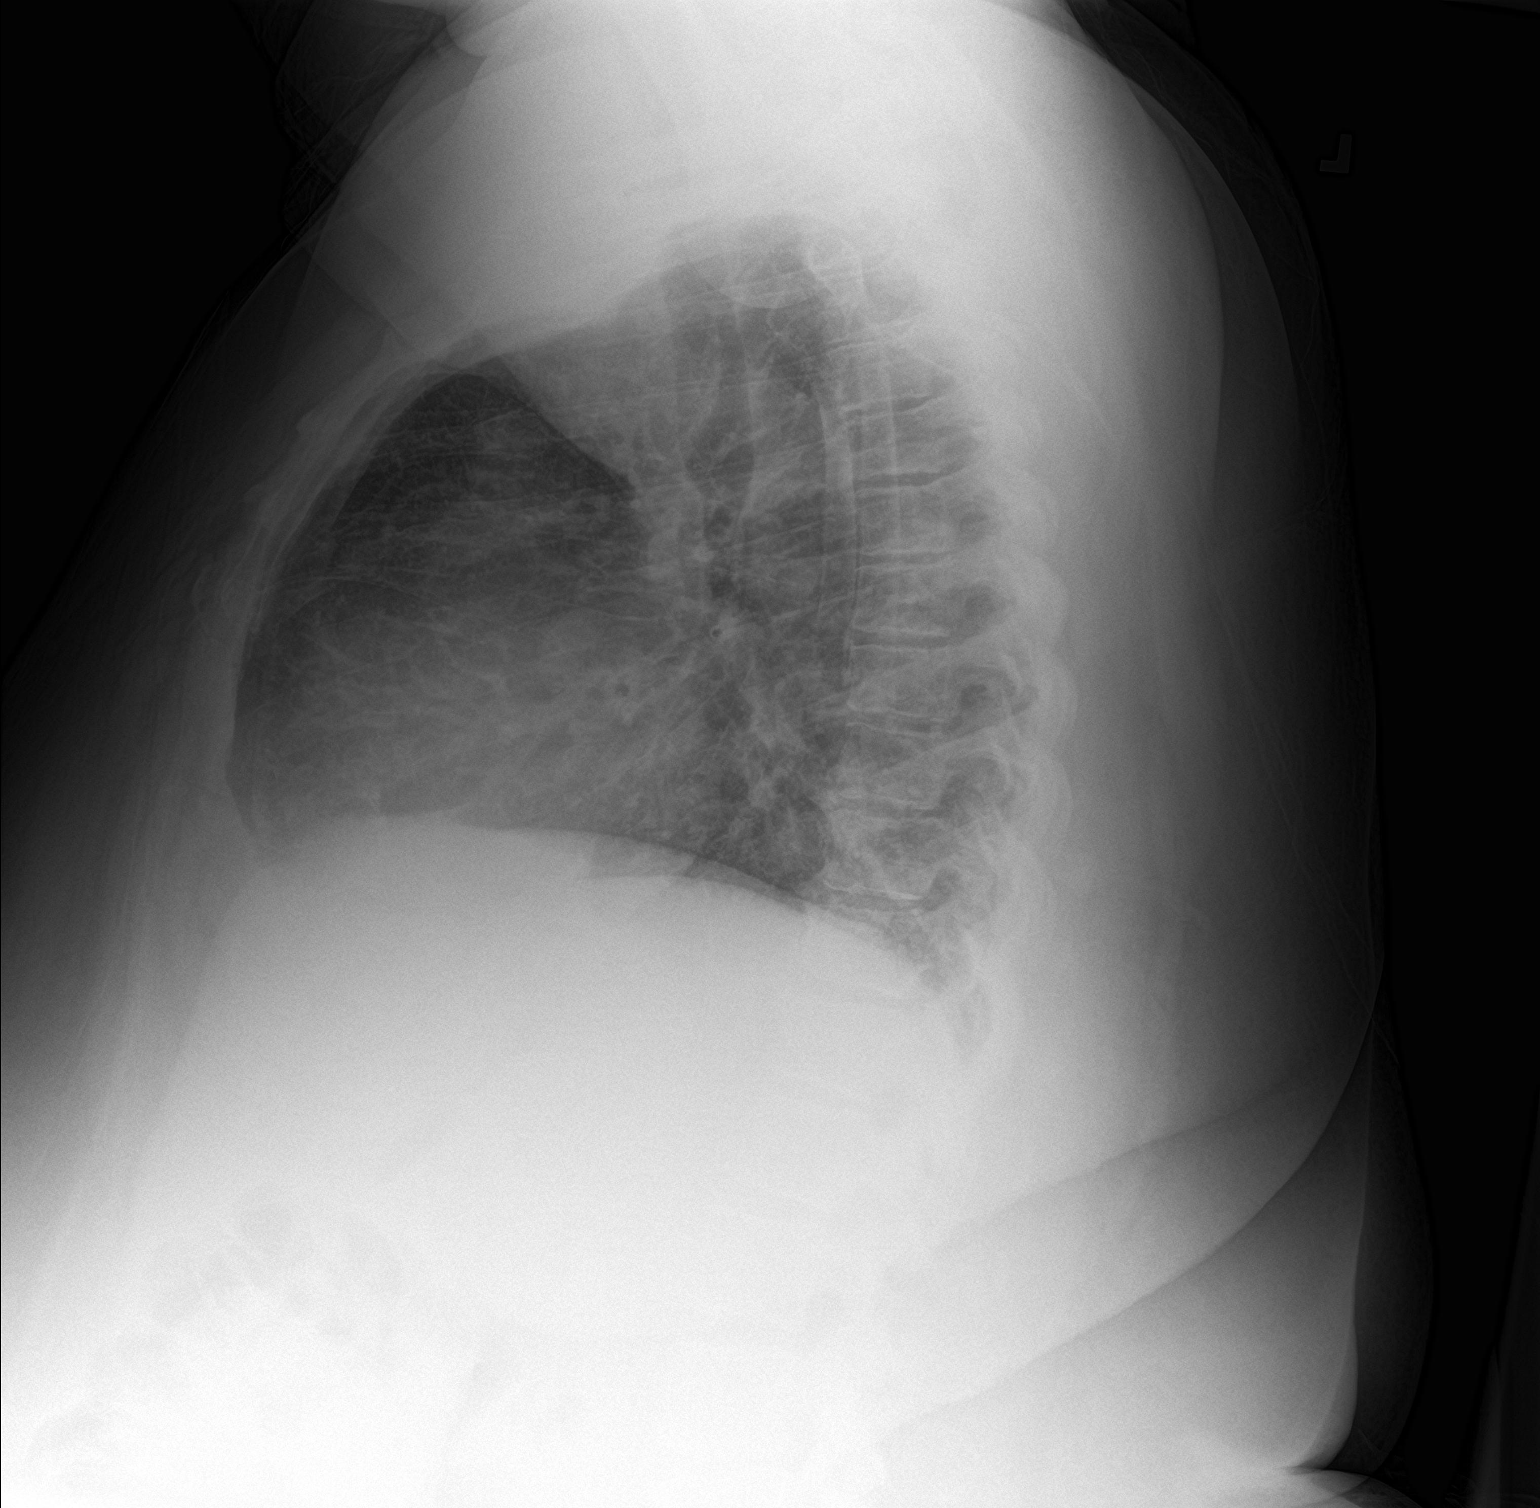

[2 of 2 positions shown; findings below may reference images not displayed]

FINDINGS: The heart size and mediastinal contours are within normal limits.
Both lungs are clear. The visualized skeletal structures are
unremarkable except for degenerative changes of the spine and old
healed right rib fractures. Trachea midline. Apical pleural
thickening noted. No pneumothorax.
IMPRESSION: No active cardiopulmonary disease.

## 2023-11-13 ENCOUNTER — Other Ambulatory Visit: Payer: Self-pay | Admitting: Family Medicine

## 2023-11-13 DIAGNOSIS — Z1231 Encounter for screening mammogram for malignant neoplasm of breast: Secondary | ICD-10-CM

## 2024-01-20 ENCOUNTER — Telehealth: Payer: Self-pay | Admitting: Physical Therapy

## 2024-01-20 ENCOUNTER — Ambulatory Visit: Attending: Family Medicine | Admitting: Physical Therapy

## 2024-01-20 NOTE — Therapy (Incomplete)
 OUTPATIENT PHYSICAL THERAPY FEMALE PELVIC EVALUATION   Patient Name: Brooke Mills MRN: [id number] DOB:1975-12-27, 48 y.o., female Today's Date: 01/20/2024  END OF SESSION:   Past Medical History:  Diagnosis Date   Asthma    Past Surgical History:  Procedure Laterality Date   HERNIA REPAIR  2016   Patient Active Problem List   Diagnosis Date Noted   Class 3 severe obesity due to excess calories without serious comorbidity with body mass index (BMI) of 50.0 to 59.9 in adult 09/14/2019   Asthma 09/14/2019   Hypertension 09/14/2019    PCP: No   REFERRING PROVIDER: Picarra, Emerald G, NP  REFERRING DIAG: N39.3 (ICD-10-CM) - Stress incontinence (female) (female)  THERAPY DIAG:  No diagnosis found.  Rationale for Evaluation and Treatment: Rehabilitation  ONSET DATE: ***  SUBJECTIVE:                                                                                                                                                                                           SUBJECTIVE STATEMENT: *** Fluid intake:   PAIN:  Are you having pain? {yes/no:20286} NPRS scale: ***/10 Pain location: {pelvic pain location:27098}  Pain type: {type:313116} Pain description: {PAIN DESCRIPTION:21022940}   Aggravating factors: *** Relieving factors: ***  PRECAUTIONS: {Therapy precautions:24002}  RED FLAGS: {PT Red Flags:29287}   WEIGHT BEARING RESTRICTIONS: {Yes ***/No:24003}  FALLS:  Has patient fallen in last 6 months? {fallsyesno:27318}  OCCUPATION: ***  ACTIVITY LEVEL : ***  PLOF: {PLOF:24004}  PATIENT GOALS: ***  PERTINENT HISTORY:  *** Sexual abuse: {Yes/No:304960894}  BOWEL MOVEMENT: Pain with bowel movement: {yes/no:20286} Type of bowel movement:{PT BM type:27100} Fully empty rectum: {No/Yes:304960894} Leakage: {Yes/No:304960894} Pads: {Yes/No:304960894} Fiber supplement/laxative {YES/NO AS:20300}  URINATION: Pain with urination: {yes/no:20286} Fully  empty bladder: {Yes/No:304960894}*** Stream: {PT urination:27102} Urgency: {YES/NO AS:20300} Frequency: *** Leakage: {PT leakage:27103} Pads: {Yes/No:304960894}  INTERCOURSE:  Ability to have vaginal penetration {YES/NO:21197} Pain with intercourse: {pain with intercourse PA:27099} Dryness{YES/NO AS:20300} Climax: *** Marinoff Scale: ***/3 Laxative:  PREGNANCY: Vaginal deliveries *** Tearing {Yes***/No:304960894} Episiotomy {YES/NO AS:20300} C-section deliveries *** Currently pregnant {Yes***/No:304960894}  PROLAPSE: {PT prolapse:27101}   OBJECTIVE:  Note: Objective measures were completed at Evaluation unless otherwise noted.  DIAGNOSTIC FINDINGS:  ***  PATIENT SURVEYS:  {rehab surveys:24030}  PFIQ-7: ***  COGNITION: Overall cognitive status: {cognition:24006}     SENSATION: Light touch: {intact/deficits:24005}  LUMBAR SPECIAL TESTS:  {lumbar special test:25242}  FUNCTIONAL TESTS:  {Functional tests:24029}  GAIT: Assistive device utilized: {Assistive devices:23999} Comments: ***  POSTURE: {posture:25561}   LUMBARAROM/PROM:  A/PROM A/PROM  eval  Flexion   Extension   Right lateral flexion   Left lateral flexion  Right rotation   Left rotation    (Blank rows = not tested)  LOWER EXTREMITY ROM:  {AROM/PROM:27142} ROM Right eval Left eval  Hip flexion    Hip extension    Hip abduction    Hip adduction    Hip internal rotation    Hip external rotation    Knee flexion    Knee extension    Ankle dorsiflexion    Ankle plantarflexion    Ankle inversion    Ankle eversion     (Blank rows = not tested)  LOWER EXTREMITY MMT:  MMT Right eval Left eval  Hip flexion    Hip extension    Hip abduction    Hip adduction    Hip internal rotation    Hip external rotation    Knee flexion    Knee extension    Ankle dorsiflexion    Ankle plantarflexion    Ankle inversion    Ankle eversion     (Blank rows = not tested) PALPATION:    General: ***  Pelvic Alignment: ***  Abdominal: ***                External Perineal Exam: ***                             Internal Pelvic Floor: ***  Patient confirms identification and approves PT to assess internal pelvic floor and treatment {yes/no:20286}  PELVIC MMT:   MMT eval  Vaginal   Internal Anal Sphincter   External Anal Sphincter   Puborectalis   Diastasis Recti   (Blank rows = not tested)        TONE: ***  PROLAPSE: ***  TODAY'S TREATMENT:                                                                                                                              DATE: ***  EVAL ***   PATIENT EDUCATION:  Education details: *** Person educated: {Person educated:25204} Education method: {Education Method:25205} Education comprehension: {Education Comprehension:25206}  HOME EXERCISE PROGRAM: ***  ASSESSMENT:  CLINICAL IMPRESSION: Patient is a *** y.o. *** who was seen today for physical therapy evaluation and treatment for ***.   OBJECTIVE IMPAIRMENTS: {opptimpairments:25111}.   ACTIVITY LIMITATIONS: {activitylimitations:27494}  PARTICIPATION LIMITATIONS: {participationrestrictions:25113}  PERSONAL FACTORS: {Personal factors:25162} are also affecting patient's functional outcome.   REHAB POTENTIAL: {rehabpotential:25112}  CLINICAL DECISION MAKING: {clinical decision making:25114}  EVALUATION COMPLEXITY: {Evaluation complexity:25115}   GOALS: Goals reviewed with patient? {yes/no:20286}  SHORT TERM GOALS: Target date: ***  *** Baseline: Goal status: INITIAL  2.  *** Baseline:  Goal status: INITIAL  3.  *** Baseline:  Goal status: INITIAL  4.  *** Baseline:  Goal status: INITIAL  5.  *** Baseline:  Goal status: INITIAL  6.  *** Baseline:  Goal status: INITIAL  LONG TERM GOALS: Target date: ***  *** Baseline:  Goal status: INITIAL  2.  *** Baseline:  Goal status: INITIAL  3.  *** Baseline:  Goal status:  INITIAL  4.  *** Baseline:  Goal status: INITIAL  5.  *** Baseline:  Goal status: INITIAL  6.  *** Baseline:  Goal status: INITIAL  PLAN:  PT FREQUENCY: {rehab frequency:25116}  PT DURATION: {rehab duration:25117}  PLANNED INTERVENTIONS: {rehab planned interventions:25118::97110-Therapeutic exercises,97530- Therapeutic [phone]- Neuromuscular re-education,97535- Self Rjmz,02859- Manual therapy}  PLAN FOR NEXT SESSION: ***   Alistair Senft, PT 01/20/2024, 8:30 AM

## 2024-01-20 NOTE — Telephone Encounter (Signed)
 Called patient about missed PT eval today, she said she forgot and would like us  to call her to reschedule.

## 2024-08-09 ENCOUNTER — Encounter: Payer: Self-pay | Admitting: Gastroenterology
